# Patient Record
Sex: Male | Born: 1995 | Race: Black or African American | Hispanic: No | Marital: Single | Smoking: Never smoker
Health system: Southern US, Community
[De-identification: ages and names within clinical notes are randomized; demographics above are authoritative.]

## PROBLEM LIST (undated history)

## (undated) DIAGNOSIS — J45909 Unspecified asthma, uncomplicated: Secondary | ICD-10-CM

## (undated) DIAGNOSIS — R011 Cardiac murmur, unspecified: Secondary | ICD-10-CM

## (undated) HISTORY — PX: EYE SURGERY: SHX253

## (undated) HISTORY — PX: TESTICLE REMOVAL: SHX68

---
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
## 2017-06-02 ENCOUNTER — Emergency Department (HOSPITAL_BASED_OUTPATIENT_CLINIC_OR_DEPARTMENT_OTHER)
Admission: EM | Admit: 2017-06-02 | Discharge: 2017-06-02 | Disposition: A | Discharge status: Home / Self Care | Payer: Medicaid Other | Attending: Emergency Medicine | Admitting: Emergency Medicine

## 2017-06-02 ENCOUNTER — Encounter (HOSPITAL_BASED_OUTPATIENT_CLINIC_OR_DEPARTMENT_OTHER): Payer: Self-pay | Admitting: Emergency Medicine

## 2017-06-02 ENCOUNTER — Emergency Department (HOSPITAL_BASED_OUTPATIENT_CLINIC_OR_DEPARTMENT_OTHER): Payer: Medicaid Other

## 2017-06-02 DIAGNOSIS — M25561 Pain in right knee: Secondary | ICD-10-CM

## 2017-06-02 DIAGNOSIS — S93401A Sprain of unspecified ligament of right ankle, initial encounter: Secondary | ICD-10-CM | POA: Insufficient documentation

## 2017-06-02 DIAGNOSIS — Y92310 Basketball court as the place of occurrence of the external cause: Secondary | ICD-10-CM | POA: Insufficient documentation

## 2017-06-02 DIAGNOSIS — Y9367 Activity, basketball: Secondary | ICD-10-CM | POA: Diagnosis not present

## 2017-06-02 DIAGNOSIS — Y998 Other external cause status: Secondary | ICD-10-CM | POA: Diagnosis not present

## 2017-06-02 DIAGNOSIS — X509XXA Other and unspecified overexertion or strenuous movements or postures, initial encounter: Secondary | ICD-10-CM | POA: Diagnosis not present

## 2017-06-02 DIAGNOSIS — S99911A Unspecified injury of right ankle, initial encounter: Secondary | ICD-10-CM | POA: Diagnosis present

## 2017-06-02 DIAGNOSIS — J45909 Unspecified asthma, uncomplicated: Secondary | ICD-10-CM | POA: Insufficient documentation

## 2017-06-02 DIAGNOSIS — Z79899 Other long term (current) drug therapy: Secondary | ICD-10-CM | POA: Diagnosis not present

## 2017-06-02 NOTE — ED Triage Notes (Signed)
Patient states that he was playing basket ball last night and twisted his right ankle and then twisted his right knee - patient walks with a limp

## 2017-06-02 NOTE — ED Provider Notes (Signed)
[facility]   CSN: [id number] Arrival date & time: 06/02/17  1011     History   Chief Complaint Chief Complaint  Patient presents with  . Knee Injury  . Foot Injury    HPI Gregory Salazar is a 21 y.o. male.  Patient presents with pain to his right knee and right ankle. He states he was playing basketball last night and twisted his knee and ankle. He states it hurts to put weight on it, mostly in his ankle. He denies any other injuries. He has not taken anything today for the pain. He describes as a constant throbbing pain.      Past Medical History:  Diagnosis Date  . Asthma     There are no active problems to display for this patient.   Past Surgical History:  Procedure Laterality Date  . EYE SURGERY    . TESTICLE REMOVAL         Home Medications    Prior to Admission medications   Medication Sig Start Date End Date Taking? Authorizing Provider  albuterol (PROVENTIL HFA;VENTOLIN HFA) 108 (90 BASE) MCG/ACT inhaler Inhale 2 puffs into the lungs every 4 (four) hours as needed for wheezing or shortness of breath. 04/22/15   Muthersbaugh, Dahlia Client, PA-C  albuterol (PROVENTIL) (2.5 MG/3ML) 0.083% nebulizer solution Take 3-6 mLs (2.5-5 mg total) by nebulization every 4 (four) hours as needed for wheezing or shortness of breath. 04/22/15   Muthersbaugh, Dahlia Client, PA-C  montelukast (SINGULAIR) 10 MG tablet Take 1 tablet (10 mg total) by mouth at bedtime. 04/22/15   Muthersbaugh, Dahlia Client, PA-C    Family History History reviewed. No pertinent family history.  Social History Social History  Substance Use Topics  . Smoking status: Never Smoker  . Smokeless tobacco: Never Used  . Alcohol use No     Allergies   Patient has no known allergies.   Review of Systems Review of Systems  Constitutional: Negative for fever.  Gastrointestinal: Negative for nausea and vomiting.  Musculoskeletal: Positive for arthralgias. Negative for back pain, joint  swelling and neck pain.  Skin: Negative for wound.  Neurological: Negative for weakness, numbness and headaches.     Physical Exam Updated Vital Signs BP (!) 147/93 (BP Location: Right Arm)   Pulse 66   Temp 98.4 F (36.9 C) (Oral)   Resp 18   Ht  (1.753 m)   Wt 72.6 kg (160 lb)   SpO2 100%   BMI 23.63 kg/m   Physical Exam  Constitutional: He is oriented to person, place, and time. He appears well-developed and well-nourished.  HENT:  Head: Normocephalic and atraumatic.  Neck: Normal range of motion. Neck supple.  Cardiovascular: Normal rate.   Pulmonary/Chest: Effort normal.  Musculoskeletal: He exhibits tenderness. He exhibits no edema.  Patient has no tenderness on palpation of the right knee. There is no swelling or effusion. No ligament laxity. There is no pain in the hip. There some mild tenderness to the bilateral malleoli of the right ankle. There is no swelling or effusion noted. No significant pain to the foot. He has normal sensation and motor function in the foot. Pedal pulses are intact. No wounds are noted.  Neurological: He is alert and oriented to person, place, and time.  Skin: Skin is warm and dry.  Psychiatric: He has a normal mood and affect.     ED Treatments / Results  Labs (all labs ordered are listed, but only abnormal results are displayed) Labs Reviewed -  No data to display  EKG  EKG Interpretation None       Radiology Dg Ankle Complete Right  Result Date: 06/02/2017 CLINICAL DATA:  21 year old male status post fall playing basketball yesterday. Right knee and ankle pain. EXAM: RIGHT ANKLE - COMPLETE 3+ VIEW COMPARISON:  None. FINDINGS: Skeletally immature. Bone mineralization is within normal limits. Preserved mortise joint alignment. Taylor dome intact. No fracture identified. Mild anterior soft tissue swelling. IMPRESSION: Anterior soft tissue swelling with no No acute fracture or dislocation identified about the right ankle.  Electronically Signed   By: Odessa Fleming M.D.   On: 06/02/2017 10:53   Dg Knee Complete 4 Views Right  Result Date: 06/02/2017 CLINICAL DATA:  21 year old male status post fall playing basketball yesterday. Right knee and ankle pain. EXAM: RIGHT KNEE - COMPLETE 4+ VIEW COMPARISON:  None. FINDINGS: Skeletally mature. Bone mineralization is within normal limits. No joint effusion. Preserved joint spaces and alignment. Patella intact. No discrete soft tissue abnormality. IMPRESSION: Negative. Electronically Signed   By: Odessa Fleming M.D.   On: 06/02/2017 10:52    Procedures Procedures (including critical care time)  Medications Ordered in ED Medications - No data to display   Initial Impression / Assessment and Plan / ED Course  I have reviewed the triage vital signs and the nursing notes.  Pertinent labs & imaging results that were available during my care of the patient were reviewed by me and considered in my medical decision making (see chart for details).     No fractures are identified. Patient was placed in an ankle brace. I don't elicit any knee tenderness I don't feel that he needs any bracing of his knee. He has no ligament laxity. He was discharged home in good condition. He was encouraged to ice and elevation. He was advised to use ibuprofen for symptomatically. He was given a referral to follow-up with Dr. Pearletha Forge if his symptoms are not improving.  Final Clinical Impressions(s) / ED Diagnoses   Final diagnoses:  Sprain of right ankle, unspecified ligament, initial encounter  Acute pain of right knee    New Prescriptions New Prescriptions   No medications on file     Rolan Bucco, MD 06/02/17 1141

## 2018-12-02 IMAGING — CR DG KNEE COMPLETE 4+V*R*
4 series · 4 of 4 positions shown · non-contrast
Comparison: None.

CLINICAL DATA: 21-year-old male status post fall playing basketball
yesterday. Right knee and ankle pain.

EXAM:
RIGHT KNEE - COMPLETE 4+ VIEW

[t knee ap right]
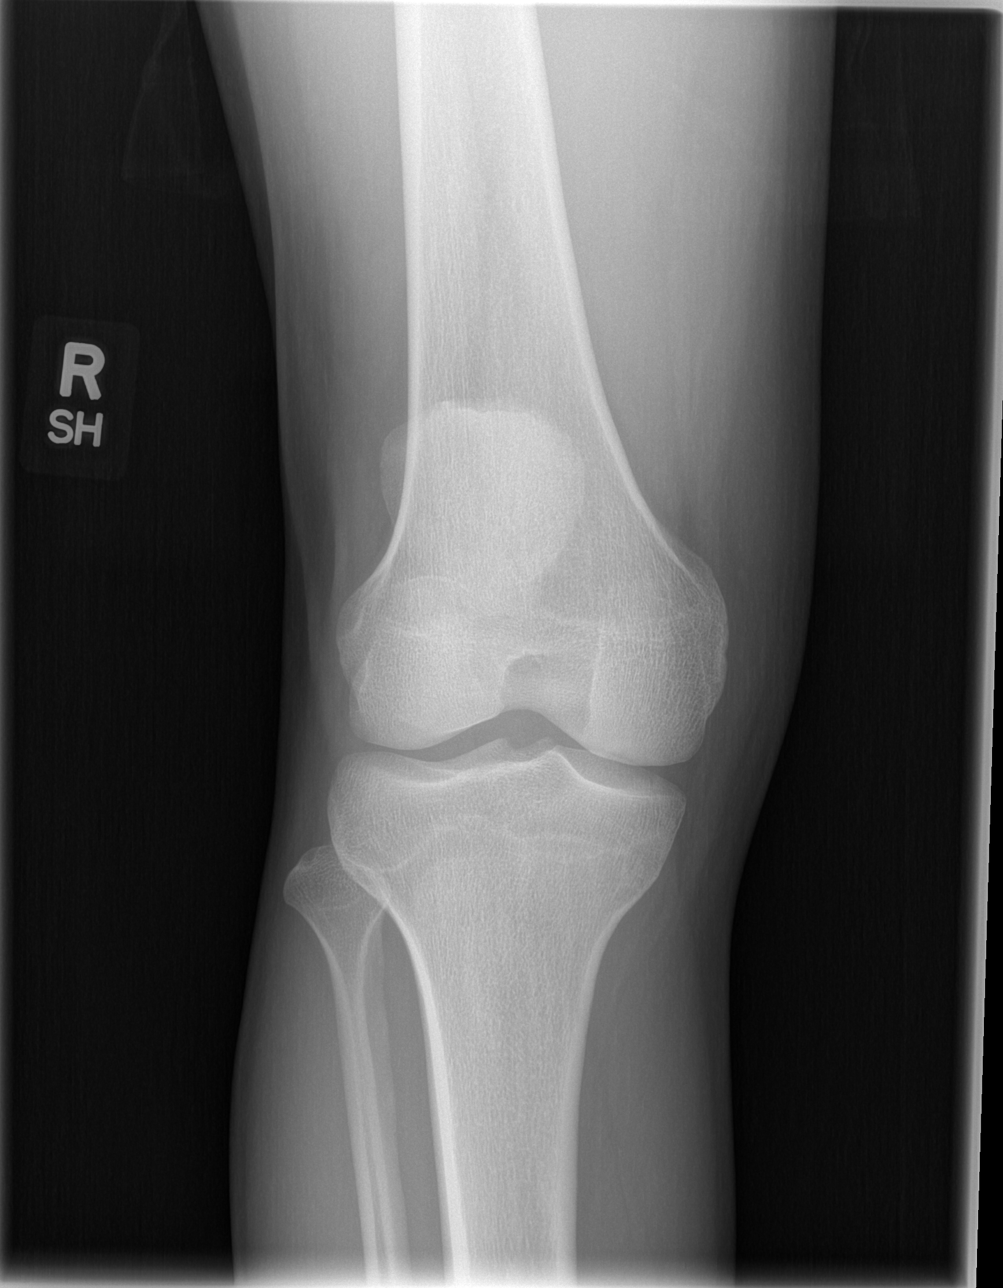

[t knee oblique right (1 of 2)]
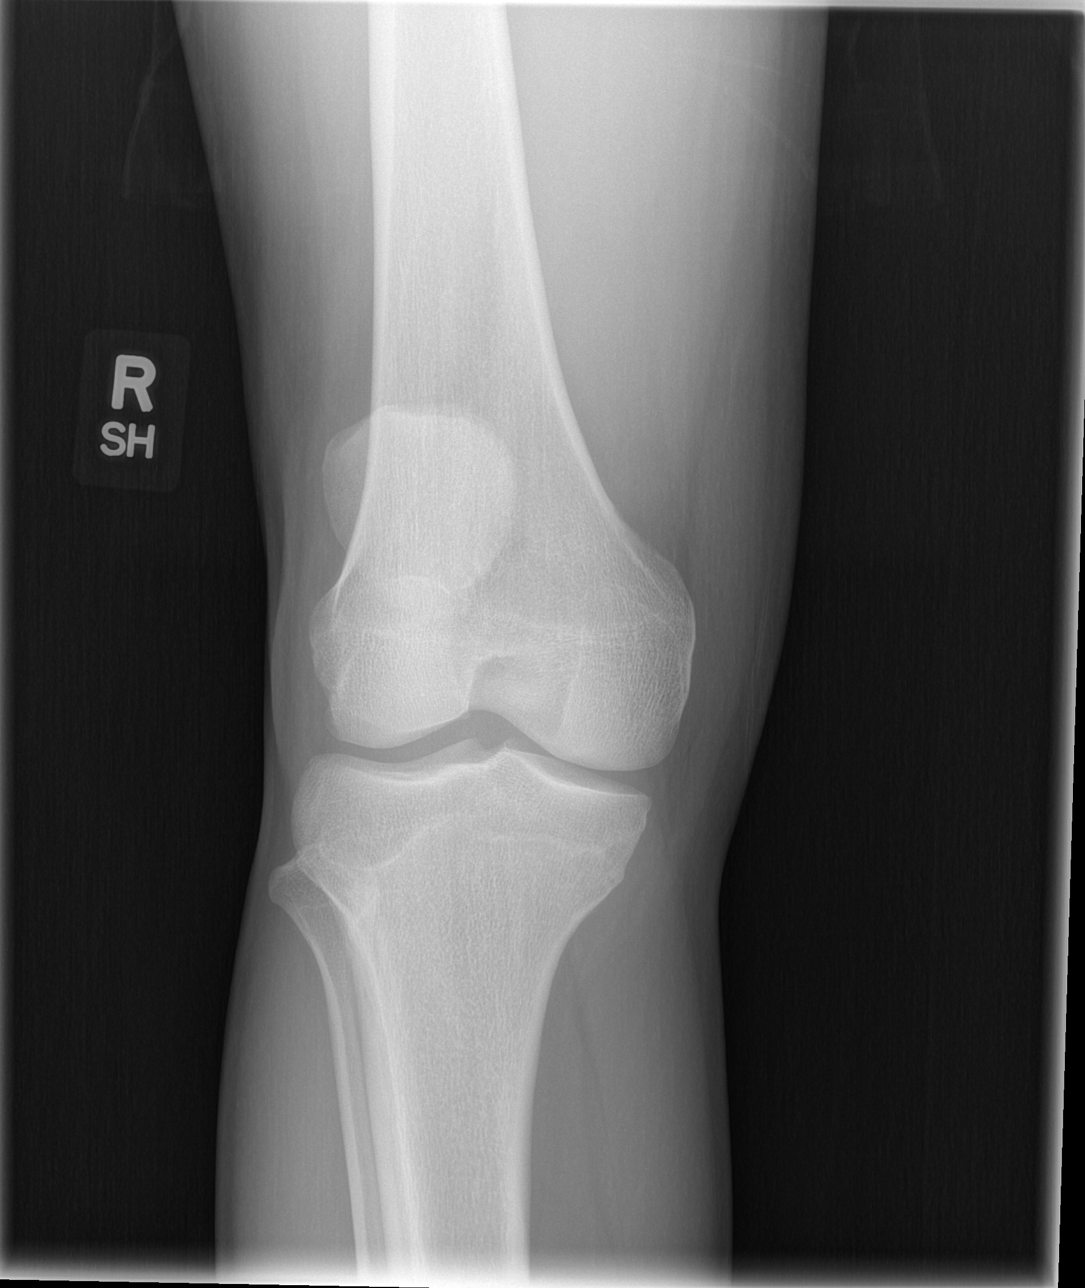

[t knee oblique right (2 of 2)]
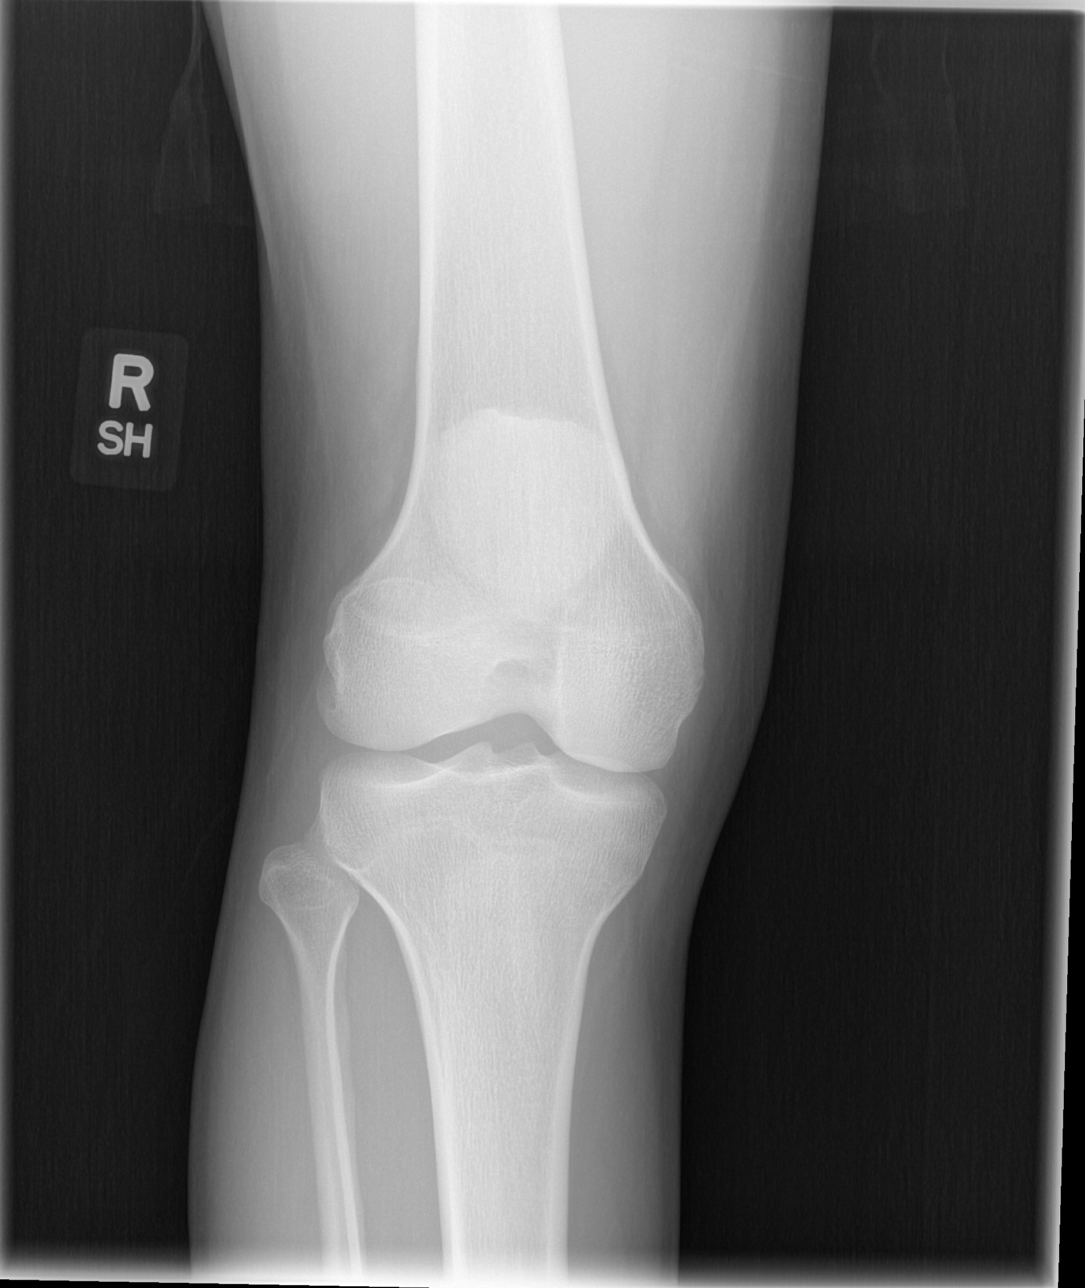

[t knee lat right]
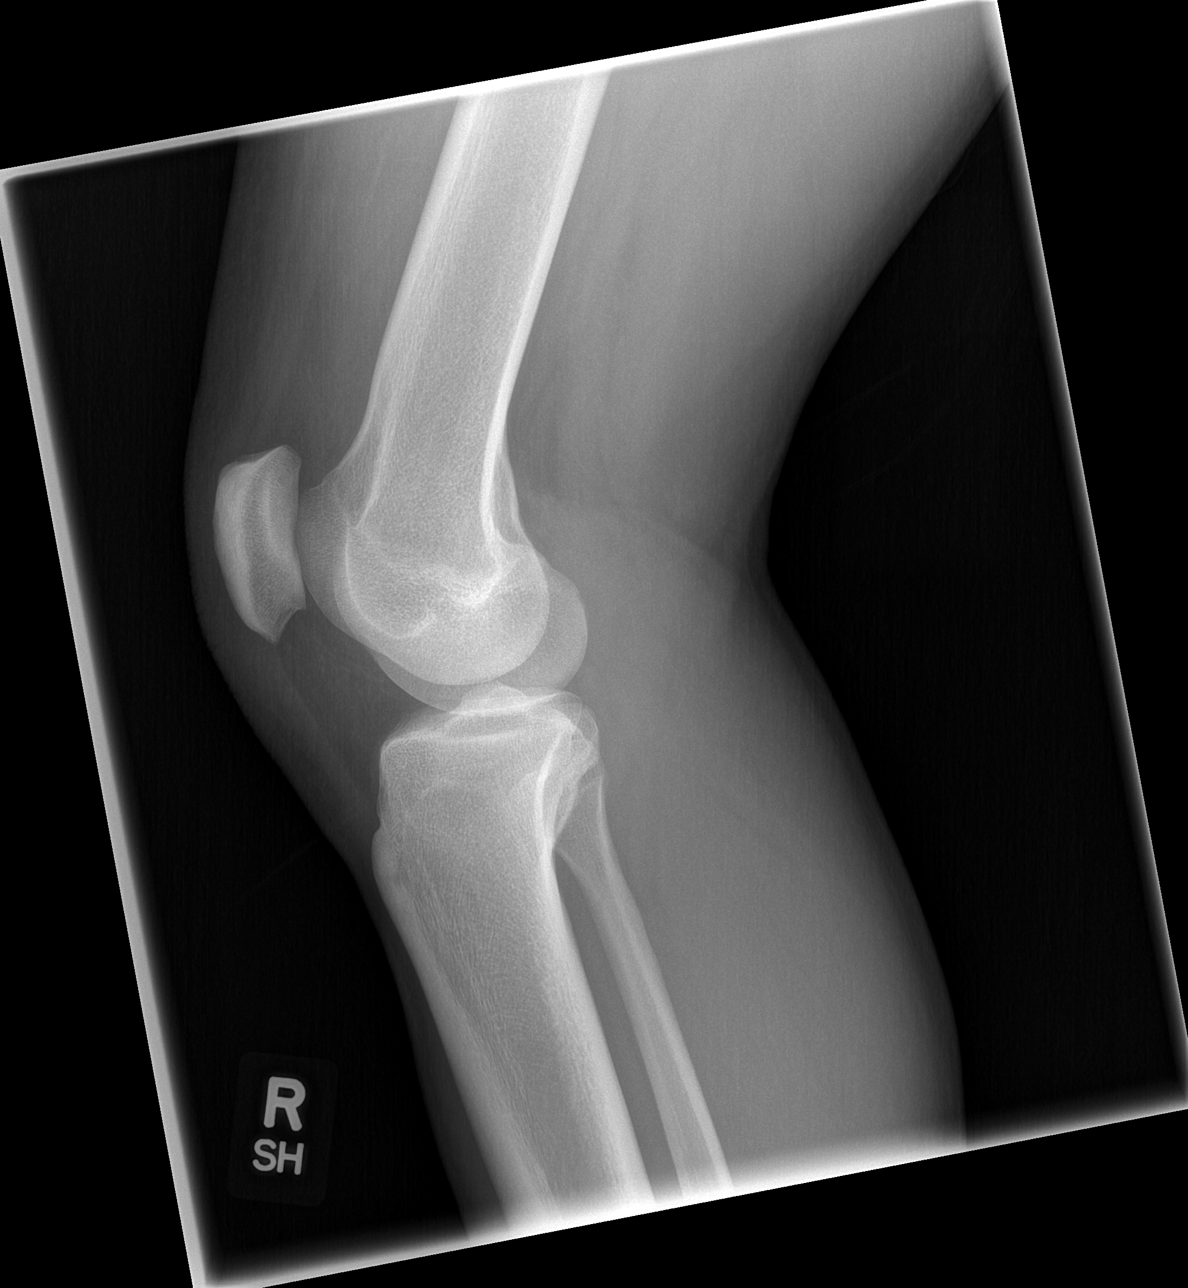

[4 of 4 positions shown; findings below may reference images not displayed]

FINDINGS: Skeletally mature. Bone mineralization is within normal limits. No
joint effusion. Preserved joint spaces and alignment. Patella
intact. No discrete soft tissue abnormality.
IMPRESSION: Negative.

## 2024-03-08 ENCOUNTER — Emergency Department (HOSPITAL_BASED_OUTPATIENT_CLINIC_OR_DEPARTMENT_OTHER)
Admission: EM | Admit: 2024-03-08 | Discharge: 2024-03-08 | Disposition: A | Discharge status: Home / Self Care | Attending: Emergency Medicine | Admitting: Emergency Medicine

## 2024-03-08 ENCOUNTER — Other Ambulatory Visit: Payer: Self-pay

## 2024-03-08 ENCOUNTER — Encounter (HOSPITAL_BASED_OUTPATIENT_CLINIC_OR_DEPARTMENT_OTHER): Payer: Self-pay | Admitting: Emergency Medicine

## 2024-03-08 DIAGNOSIS — H538 Other visual disturbances: Secondary | ICD-10-CM | POA: Insufficient documentation

## 2024-03-08 DIAGNOSIS — J45909 Unspecified asthma, uncomplicated: Secondary | ICD-10-CM | POA: Diagnosis not present

## 2024-03-08 DIAGNOSIS — R519 Headache, unspecified: Secondary | ICD-10-CM | POA: Insufficient documentation

## 2024-03-08 DIAGNOSIS — Z8547 Personal history of malignant neoplasm of testis: Secondary | ICD-10-CM | POA: Diagnosis not present

## 2024-03-08 DIAGNOSIS — R220 Localized swelling, mass and lump, head: Secondary | ICD-10-CM | POA: Insufficient documentation

## 2024-03-08 NOTE — ED Notes (Signed)
 Pt alert and oriented X 4 at the time of discharge. RR even and unlabored. No acute distress noted. Pt verbalized understanding of discharge instructions as discussed. Pt ambulatory to lobby at time of discharge.

## 2024-03-08 NOTE — ED Provider Notes (Signed)
 [facility] AT [facility] Provider Note   CSN: [id number] Arrival date & time: 03/08/24  1457     Patient presents with: Facial Swelling   Gregory Salazar is a 28 y.o. male with past medical history of asthma, testicular cancer (in remission) presents to emergency department for evaluation of knot above left eyebrow that he noticed today.  He reports right eye blurred vision for 30 seconds then subsequently broke his eyes and blurry vision resolved.  That is when he noticed the knot.  Also had mild headache earlier this morning for which she took 2 Advil completely resolving headache.  Denies dizziness, photophobia, unsteady gait, slurred speech   HPI     Prior to Admission medications   Medication Sig Start Date End Date Taking? Authorizing Provider  albuterol  (PROVENTIL  HFA;VENTOLIN  HFA) 108 (90 BASE) MCG/ACT inhaler Inhale 2 puffs into the lungs every 4 (four) hours as needed for wheezing or shortness of breath. 04/22/15   Muthersbaugh, Chiquita, PA-C  albuterol  (PROVENTIL ) (2.5 MG/3ML) 0.083% nebulizer solution Take 3-6 mLs (2.5-5 mg total) by nebulization every 4 (four) hours as needed for wheezing or shortness of breath. 04/22/15   Muthersbaugh, Chiquita, PA-C  montelukast  (SINGULAIR ) 10 MG tablet Take 1 tablet (10 mg total) by mouth at bedtime. 04/22/15   Muthersbaugh, Chiquita, PA-C    Allergies: Patient has no known allergies.    Review of Systems  Eyes:  Positive for visual disturbance.    Updated Vital Signs BP (!) 158/108 (BP Location: Left Arm)   Pulse 71   Temp 98.1 F (36.7 C)   Resp 18   Ht 5' 9 (1.753 m)   SpO2 99%   BMI 23.63 kg/m   Physical Exam Vitals and nursing note reviewed.  Constitutional:      General: He is not in acute distress.    Appearance: Normal appearance. He is not ill-appearing.  HENT:     Head: Normocephalic and atraumatic.  Eyes:     General: Lids are normal. Vision grossly intact. No visual  field deficit.    Extraocular Movements: Extraocular movements intact.     Right eye: Normal extraocular motion and no nystagmus.     Left eye: Normal extraocular motion and no nystagmus.     Conjunctiva/sclera: Conjunctivae normal.     Pupils: Pupils are equal, round, and reactive to light.  Cardiovascular:     Rate and Rhythm: Normal rate.     Heart sounds: Normal heart sounds.  Pulmonary:     Effort: Pulmonary effort is normal. No respiratory distress.     Breath sounds: Normal breath sounds.  Musculoskeletal:     Cervical back: Normal range of motion and neck supple. No rigidity.  Skin:    Coloration: Skin is not jaundiced or pale.  Neurological:     General: No focal deficit present.     Mental Status: He is alert and oriented to person, place, and time. Mental status is at baseline.     GCS: GCS eye subscore is 4. GCS verbal subscore is 5. GCS motor subscore is 6.     Cranial Nerves: No cranial nerve deficit, dysarthria or facial asymmetry.     Sensory: No sensory deficit.     Motor: No weakness, abnormal muscle tone, seizure activity or pronator drift.     Coordination: Coordination normal. Finger-Nose-Finger Test and Heel to [facility] Test normal.     Gait: Gait normal.     Deep Tendon Reflexes: Reflexes normal.  Comments: Following commands appropriately.  No dizziness nor unsteady gait.  Motor 5/5 and sensation 2/2 of BUE and BLE     (all labs ordered are listed, but only abnormal results are displayed) Labs Reviewed - No data to display  EKG: None  Radiology: No results found.    Medications Ordered in the ED - No data to display                                  Medical Decision Making    Patient presents to the ED for concern of right eye blurred vision, left eyebrow swelling, this involves an extensive number of treatment options, and is a complaint that carries with it a high risk of complications and morbidity.  The differential diagnosis includes  glaucoma, foreign body, ulcer, abrasion, infection, contusion, laceration, abscess   Co morbidities that complicate the patient evaluation  None   Additional history obtained:  Additional history obtained from Nursing   External records from outside source obtained and reviewed including triage RN note    Problem List / ED Course:  Right eye blurred vision Denies foreign body sensation, eye pain, redness, pruritus Transient and resolved on its own within 30 seconds following rubbing his eye I offered to check eye pressure to ensure no glaucoma however he refuses this. No obvious cataract, ulcer In the ED, neurologically intact with no motor or sensory deficits nor dizziness Facial swelling I am unable to appreciate a nodule, fluctuance, mass on exam.  There is no overlying skin changes to indicate infection or injury.  The area that he points to is nontender nor obviously swollen He is maintaining secretions without difficulty.  No submandibular tenderness nor swelling.  No dental tenderness nor dental abscess on exam EOM intact bilaterally with no signs of orbital entrapment.  No nystagmus nor visual disturbances in ED Will provide ENT follow-up if he wishes to have this looked at further but I do not see any emergent cause of symptoms   Reevaluation:  After the interventions noted above, I reevaluated the patient and found that they have :stayed the same   Social Determinants of Health:  No PCP-provider recommendation   Dispostion:  After consideration of the diagnostic results and the patients response to treatment, I feel that the patent would benefit from outpatient management symptomatic care.   Discussed ED workup, disposition, return to ED precautions with patient who expresses understanding agrees with plan.  All questions answered to their satisfaction.  They are agreeable to plan.  Discharge instructions provided on paperwork  Final diagnoses:  Blurred  vision, right eye  Left facial swelling    ED Discharge Orders     None          Minnie Tinnie BRAVO, PA 03/08/24 1612    Randol Simmonds, MD 03/09/24 1715

## 2024-03-08 NOTE — ED Triage Notes (Signed)
 Pt POV c/o knot to above L eyebrow starting today.   Denies known injury.  Reports taking a drink of coffee this AM, vision went blurry in R eye, then he noticed knot.  Denies blurred vision at time of triage.

## 2024-03-08 NOTE — Discharge Instructions (Addendum)
 Thank you for letting us  evaluate you today.  Luckily, your blurred vision and headache have resolved.  As for concern regarding left facial swelling, you may follow-up with ENT as noted above if concern.  It does not appear infectious nor my able to palpate a specific nodule.  You also have mildly elevated blood pressure here in emergency department which may be related to being in the emergency department, stress, anxiety.  Please establish care with a primary care provider to have routine medical exams, blood pressure checks  Please return to Emergency Department if you experience continued blurred vision, loss of vision, dizziness at rest, facial swelling

## 2024-04-09 ENCOUNTER — Encounter (HOSPITAL_BASED_OUTPATIENT_CLINIC_OR_DEPARTMENT_OTHER): Payer: Self-pay

## 2024-04-09 ENCOUNTER — Other Ambulatory Visit: Payer: Self-pay

## 2024-04-09 ENCOUNTER — Emergency Department (HOSPITAL_BASED_OUTPATIENT_CLINIC_OR_DEPARTMENT_OTHER)
Admission: EM | Admit: 2024-04-09 | Discharge: 2024-04-09 | Disposition: A | Discharge status: Home / Self Care | Payer: Self-pay

## 2024-04-09 DIAGNOSIS — M545 Low back pain, unspecified: Secondary | ICD-10-CM | POA: Insufficient documentation

## 2024-04-09 MED ORDER — CYCLOBENZAPRINE HCL 10 MG PO TABS: 10.0000 mg | ORAL_TABLET | Freq: Two times a day (BID) | ORAL | 0 refills | Status: AC | PRN

## 2024-04-09 MED ORDER — CELECOXIB 200 MG PO CAPS: 200.0000 mg | ORAL_CAPSULE | Freq: Two times a day (BID) | ORAL | 0 refills | Status: AC | PRN

## 2024-04-09 MED ORDER — KETOROLAC TROMETHAMINE 30 MG/ML IJ SOLN
30.0000 mg | Freq: Once | INTRAMUSCULAR | Status: AC
  Administered 2024-04-09: 30 mg via INTRAMUSCULAR
  Filled 2024-04-09: qty 1

## 2024-04-09 NOTE — ED Triage Notes (Signed)
 Mid lower back pain since Monday. States pain is worse when sitting down. States may have strained it at work   Denies urinary symptoms

## 2024-04-09 NOTE — ED Provider Notes (Signed)
 [place] [facility] AT [facility] Provider Note   CSN: [id number] Arrival date & time: 04/09/24  1835     Patient presents with: Back Pain   Jeremy Hester is a 28 y.o. male.    Back Pain   28 year old male presents to the emergency department with complaints of low back pain.  Back pain began on Monday.  States that he works standing up mainly.  States he has a dull low back pain that worsened with movement.  States he initially had some ibuprofen as well as some Aleve at home which should help significantly with his back pain but ran out of this medicine.  States over the past couple of days, symptoms have worsened.  States that he is woken up in the night feeling like his back is spasming.  Denies any fevers, chills, saddle anesthesia, bowel/bladder dysfunction, weakness/sensory deficits in lower extremities, IV drug use history, no malignancy.  Denies any urinary symptoms, radiation of pain in the abdomen.  No significant pertinent past medical history.  Prior to Admission medications   Medication Sig Start Date End Date Taking? Authorizing Provider  ALBUTEROL IN Inhale into the lungs.    [provider]  predniSONE (DELTASONE) 20 MG tablet Take 2 tablets (40 mg total) by mouth daily. 07/13/16   Muthersbaugh, Chiquita, PA-C    Allergies: Patient has no known allergies.    Review of Systems  Musculoskeletal:  Positive for back pain.  All other systems reviewed and are negative.   Updated Vital Signs BP (!) 163/108 (BP Location: Right Arm)   Pulse 88   Temp 98.2 F (36.8 C) (Oral)   Resp 18   Wt 108.9 kg   SpO2 100%   BMI 30.81 kg/m   Physical Exam Vitals and nursing note reviewed.  Constitutional:      General: He is not in acute distress.    Appearance: He is well-developed.  HENT:     Head: Normocephalic and atraumatic.  Eyes:     Conjunctiva/sclera: Conjunctivae normal.  Cardiovascular:     Rate and Rhythm: Normal rate and  regular rhythm.     Heart sounds: No murmur heard. Pulmonary:     Effort: Pulmonary effort is normal. No respiratory distress.     Breath sounds: Normal breath sounds.  Abdominal:     Palpations: Abdomen is soft.     Tenderness: There is no abdominal tenderness.  Musculoskeletal:        General: No swelling.     Cervical back: Neck supple.     Comments: No midline tenderness lumbar spine.  Left greater than right paraspinal tenderness in lumbar region.  Straight strength 5 out of 5 lower extremities.  No sensory deficits along major nerve distributions of lower extremities.  DTR symmetric at patella as well as Achilles.  Pedal and posterior tibial pulses 2+ bilaterally.  Skin:    General: Skin is warm and dry.     Capillary Refill: Capillary refill takes less than 2 seconds.  Neurological:     Mental Status: He is alert.  Psychiatric:        Mood and Affect: Mood normal.     (all labs ordered are listed, but only abnormal results are displayed) Labs Reviewed - No data to display  EKG: None  Radiology: No results found.   Procedures   Medications Ordered in the ED - No data to display  Medical Decision Making Risk Prescription drug management.   This patient presents to the ED for concern of back pain, this involves an extensive number of treatment options, and is a complaint that carries with it a high risk of complications and morbidity.  The differential diagnosis includes fracture, strain/sprain, dislocation synovitis, nephrolithiasis, cauda equina, spinal abscess, other spinal cord compression/impingement, other   Co morbidities that complicate the patient evaluation  See HPI   Additional history obtained:  Additional history obtained from EMR External records from outside source obtained and reviewed including hospital records   Lab Tests:  N/a   Imaging Studies ordered:  N/a   Cardiac Monitoring: /  EKG:  N/a   Consultations Obtained:  N/a   Problem List / ED Course / Critical interventions / Medication management  Low back pain I ordered medication including Toradol   Reevaluation of the patient after these medicines showed that the patient improved I have reviewed the patients home medicines and have made adjustments as needed   Social Determinants of Health:  Denies tobacco, illicit drug use.   Test / Admission - Considered:  Low back pain Vitals signs significant for hypertension. Otherwise within normal range and stable throughout visit. 28 year old male presents to the emergency department with complaints of low back pain.  Back pain began on Monday.  States that he works standing up mainly.  States he has a dull low back pain that worsened with movement.  States he initially had some ibuprofen as well as some Aleve at home which should help significantly with his back pain but ran out of this medicine.  States over the past couple of days, symptoms have worsened.  States that he is woken up in the night feeling like his back is spasming.  Denies any fevers, chills, saddle anesthesia, bowel/bladder dysfunction, weakness/sensory deficits in lower extremities, IV drug use history, no malignancy.  Denies any urinary symptoms, radiation of pain in the abdomen. On exam, paraspinal tenderness in lumbar region as above.  No red flag signs or back pain on HPI/PE; low suspicion for cauda equina, spinal epidural abscess, other spinal cord compression/impingement.  Patient without urinary symptoms, radiation of pain to the abdomen, CVA tenderness; low suspicion for pyelonephritis/nephrolithiasis.  No traumatic mechanism reported without midline tenderness; low suspicion for fracture/dislocation.  Patient without abdominal pain rating to back, pulse deficits, neurodeficits; low suspicion for dissection.  Suspect MSK etiology of symptoms.  Will try therapy as an AVS with follow-up with  primary care in the outpatient setting for reassessment.  Treatment plan discussed with patient and he acknowledged understanding was agreeable to said plan.  Patient well-appearing, afebrile in no acute distress, discharge. Worrisome signs and symptoms were discussed with the patient, and the patient acknowledged understanding to return to the ED if noticed. Patient was stable upon discharge.       Final diagnoses:  None    ED Discharge Orders     None          Silver Wonda LABOR, [place] 04/09/24 1904    Gennaro Duwaine CROME, DO 04/09/24 1958

## 2024-04-09 NOTE — Discharge Instructions (Signed)
 As discussed, suspect that your back pain is from a musculoskeletal origin.  Take anti-inflammatory as well as muscle laxer as prescribed.  Muscle laxer can cause drowsiness so please do not drive or perform any high-risk until you realize its effects on you.  Recommend follow-up with primary care for reassessment.
